# Patient Record
Sex: Female | Born: 1980 | Race: Black or African American | Hispanic: No | Marital: Married | Smoking: Current every day smoker
Health system: Southern US, Community
[De-identification: ages and names within clinical notes are randomized; demographics above are authoritative.]

## PROBLEM LIST (undated history)

## (undated) DIAGNOSIS — F32A Depression, unspecified: Secondary | ICD-10-CM

## (undated) DIAGNOSIS — F329 Major depressive disorder, single episode, unspecified: Secondary | ICD-10-CM

## (undated) DIAGNOSIS — E282 Polycystic ovarian syndrome: Secondary | ICD-10-CM

## (undated) HISTORY — DX: Polycystic ovarian syndrome: E28.2

## (undated) HISTORY — PX: GALLBLADDER SURGERY: SHX652

## (undated) HISTORY — DX: Major depressive disorder, single episode, unspecified: F32.9

## (undated) HISTORY — PX: TONSILLECTOMY: SUR1361

## (undated) HISTORY — DX: Depression, unspecified: F32.A

## (undated) HISTORY — PX: SLEEVE GASTROPLASTY: SHX1101

---
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
## 2002-08-05 ENCOUNTER — Emergency Department (HOSPITAL_COMMUNITY): Admission: EM | Admit: 2002-08-05 | Discharge: 2002-08-05 | Payer: Self-pay | Admitting: *Deleted

## 2002-08-05 ENCOUNTER — Encounter: Payer: Self-pay | Admitting: *Deleted

## 2002-10-09 ENCOUNTER — Encounter: Payer: Self-pay | Admitting: Emergency Medicine

## 2002-10-09 ENCOUNTER — Emergency Department (HOSPITAL_COMMUNITY): Admission: EM | Admit: 2002-10-09 | Discharge: 2002-10-09 | Payer: Self-pay | Admitting: Emergency Medicine

## 2003-01-25 ENCOUNTER — Other Ambulatory Visit: Admission: RE | Admit: 2003-01-25 | Discharge: 2003-01-25 | Payer: Self-pay | Admitting: Gynecology

## 2003-11-05 ENCOUNTER — Other Ambulatory Visit: Admission: RE | Admit: 2003-11-05 | Discharge: 2003-11-05 | Payer: Self-pay | Admitting: Gynecology

## 2003-12-05 ENCOUNTER — Encounter: Admission: RE | Admit: 2003-12-05 | Discharge: 2004-03-04 | Payer: Self-pay | Admitting: Gynecology

## 2004-01-20 ENCOUNTER — Inpatient Hospital Stay (HOSPITAL_COMMUNITY): Admission: AD | Admit: 2004-01-20 | Discharge: 2004-01-20 | Payer: Self-pay | Admitting: Gynecology

## 2004-04-04 ENCOUNTER — Inpatient Hospital Stay (HOSPITAL_COMMUNITY): Admission: AD | Admit: 2004-04-04 | Discharge: 2004-04-04 | Payer: Self-pay | Admitting: Gynecology

## 2004-04-20 ENCOUNTER — Inpatient Hospital Stay (HOSPITAL_COMMUNITY): Admission: AD | Admit: 2004-04-20 | Discharge: 2004-04-24 | Payer: Self-pay | Admitting: Gynecology

## 2004-04-21 ENCOUNTER — Encounter (INDEPENDENT_AMBULATORY_CARE_PROVIDER_SITE_OTHER): Payer: Self-pay | Admitting: Specialist

## 2004-06-11 ENCOUNTER — Other Ambulatory Visit: Admission: RE | Admit: 2004-06-11 | Discharge: 2004-06-11 | Payer: Self-pay | Admitting: Gynecology

## 2005-07-01 ENCOUNTER — Other Ambulatory Visit: Admission: RE | Admit: 2005-07-01 | Discharge: 2005-07-01 | Payer: Self-pay | Admitting: Gynecology

## 2005-09-03 ENCOUNTER — Emergency Department (HOSPITAL_COMMUNITY): Admission: EM | Admit: 2005-09-03 | Discharge: 2005-09-03 | Payer: Self-pay | Admitting: Emergency Medicine

## 2006-05-02 ENCOUNTER — Emergency Department (HOSPITAL_COMMUNITY): Admission: EM | Admit: 2006-05-02 | Discharge: 2006-05-03 | Payer: Self-pay | Admitting: Emergency Medicine

## 2006-05-09 ENCOUNTER — Emergency Department (HOSPITAL_COMMUNITY): Admission: EM | Admit: 2006-05-09 | Discharge: 2006-05-10 | Payer: Self-pay | Admitting: Emergency Medicine

## 2006-07-23 ENCOUNTER — Emergency Department (HOSPITAL_COMMUNITY): Admission: EM | Admit: 2006-07-23 | Discharge: 2006-07-23 | Payer: Self-pay | Admitting: Emergency Medicine

## 2006-08-11 ENCOUNTER — Ambulatory Visit (HOSPITAL_COMMUNITY): Admission: RE | Admit: 2006-08-11 | Discharge: 2006-08-11 | Payer: Self-pay | Admitting: Obstetrics

## 2006-08-31 ENCOUNTER — Emergency Department (HOSPITAL_COMMUNITY): Admission: EM | Admit: 2006-08-31 | Discharge: 2006-08-31 | Payer: Self-pay | Admitting: Emergency Medicine

## 2006-12-19 ENCOUNTER — Emergency Department (HOSPITAL_COMMUNITY): Admission: EM | Admit: 2006-12-19 | Discharge: 2006-12-19 | Payer: Self-pay | Admitting: Emergency Medicine

## 2007-04-26 ENCOUNTER — Emergency Department (HOSPITAL_COMMUNITY): Admission: EM | Admit: 2007-04-26 | Discharge: 2007-04-26 | Payer: Self-pay | Admitting: Emergency Medicine

## 2007-05-15 ENCOUNTER — Emergency Department (HOSPITAL_COMMUNITY): Admission: EM | Admit: 2007-05-15 | Discharge: 2007-05-15 | Payer: Self-pay | Admitting: Emergency Medicine

## 2007-05-17 ENCOUNTER — Emergency Department (HOSPITAL_COMMUNITY): Admission: EM | Admit: 2007-05-17 | Discharge: 2007-05-18 | Payer: Self-pay | Admitting: Emergency Medicine

## 2007-05-22 ENCOUNTER — Emergency Department (HOSPITAL_COMMUNITY): Admission: EM | Admit: 2007-05-22 | Discharge: 2007-05-22 | Payer: Self-pay | Admitting: Emergency Medicine

## 2007-07-12 ENCOUNTER — Emergency Department (HOSPITAL_COMMUNITY): Admission: EM | Admit: 2007-07-12 | Discharge: 2007-07-12 | Payer: Self-pay | Admitting: Emergency Medicine

## 2007-09-27 ENCOUNTER — Emergency Department (HOSPITAL_COMMUNITY): Admission: EM | Admit: 2007-09-27 | Discharge: 2007-09-27 | Payer: Self-pay | Admitting: Emergency Medicine

## 2007-10-05 ENCOUNTER — Emergency Department (HOSPITAL_COMMUNITY): Admission: EM | Admit: 2007-10-05 | Discharge: 2007-10-05 | Payer: Self-pay | Admitting: Emergency Medicine

## 2007-10-16 ENCOUNTER — Emergency Department (HOSPITAL_COMMUNITY): Admission: EM | Admit: 2007-10-16 | Discharge: 2007-10-16 | Payer: Self-pay | Admitting: Emergency Medicine

## 2008-06-28 IMAGING — CR DG PORTABLE PELVIS
1 series · 1 of 1 positions shown · non-contrast
Comparison: none

CLINICAL DATA: Low back pain.  
PORTABLE PELVIS ? 1 VIEW:

[view not recorded]
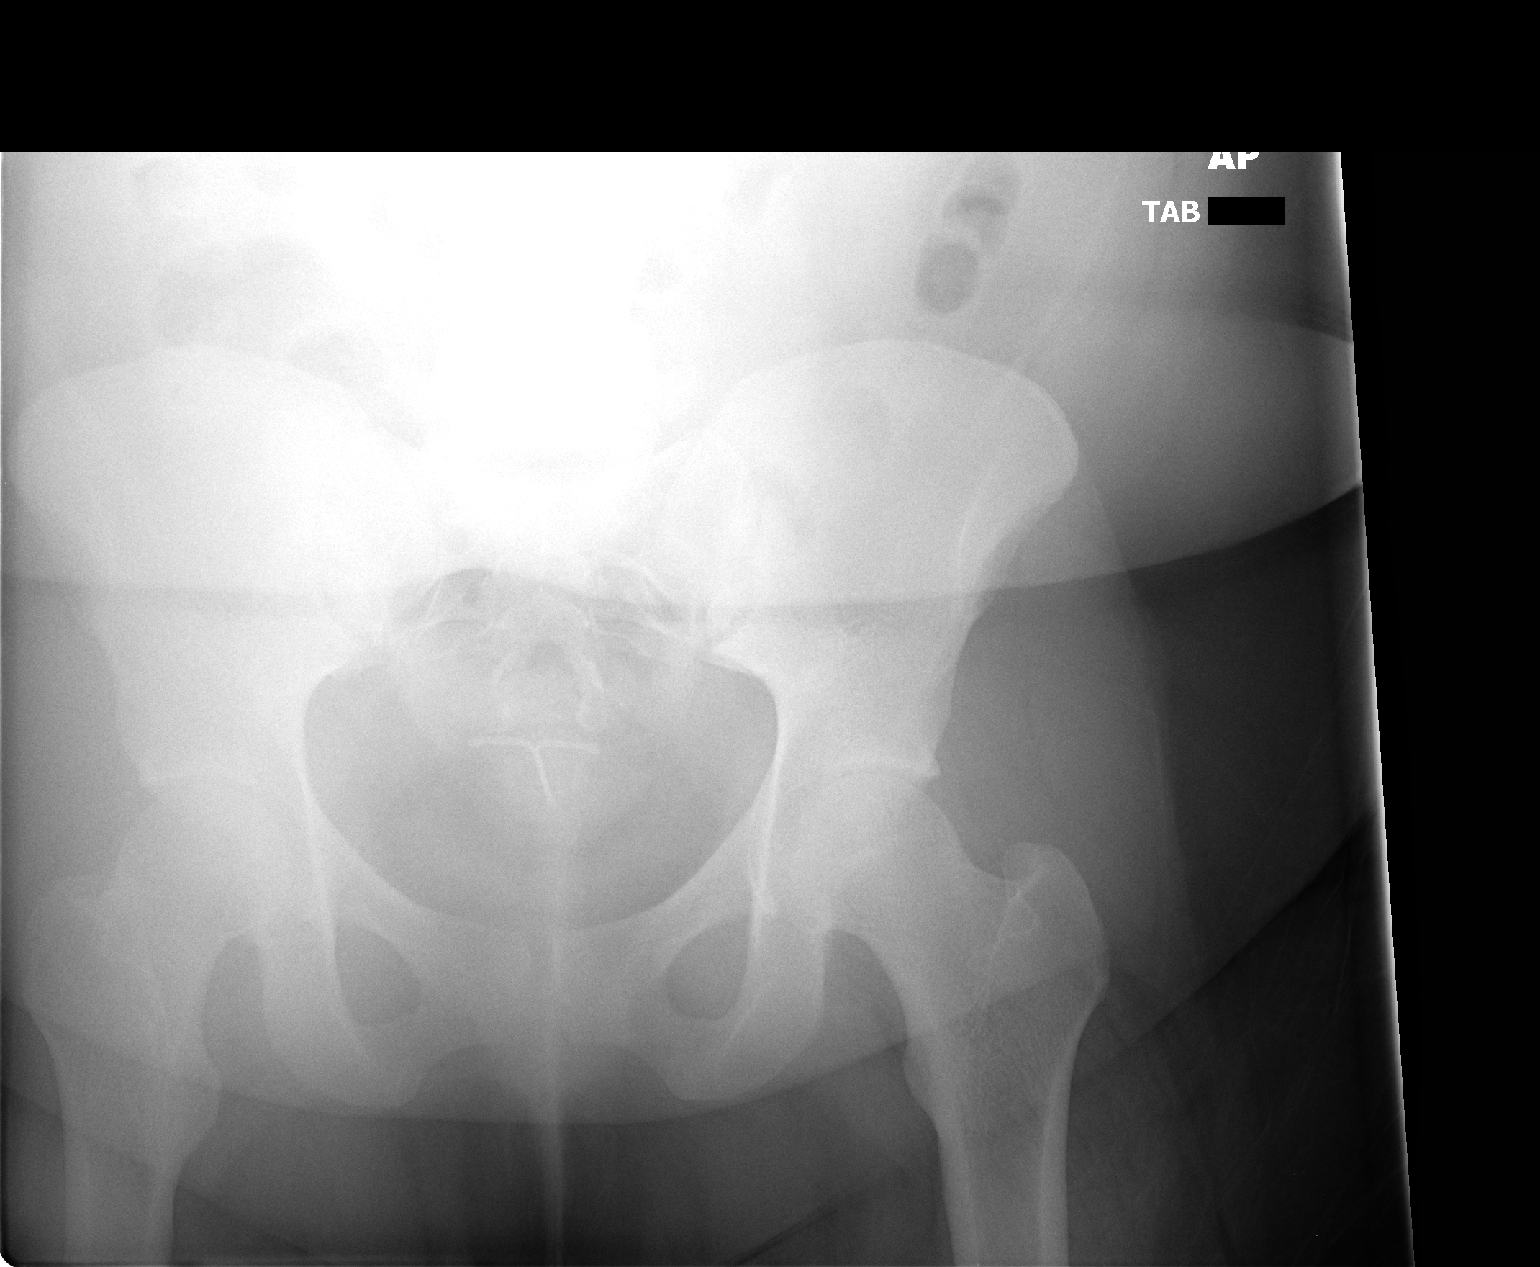

[1 of 1 positions shown; findings below may reference images not displayed]

FINDINGS: Exam is limited because of patient?s size.  No displaced pelvic fracture or diastasis.  IUD projects over the sacrum.
IMPRESSION: Limited exam but no acute finding.

## 2008-09-10 ENCOUNTER — Encounter: Payer: Self-pay | Admitting: Gynecology

## 2008-09-10 ENCOUNTER — Ambulatory Visit: Payer: Self-pay | Admitting: Gynecology

## 2008-09-10 ENCOUNTER — Other Ambulatory Visit: Admission: RE | Admit: 2008-09-10 | Discharge: 2008-09-10 | Payer: Self-pay | Admitting: Gynecology

## 2008-09-24 ENCOUNTER — Ambulatory Visit: Payer: Self-pay | Admitting: Gynecology

## 2008-10-01 ENCOUNTER — Emergency Department (HOSPITAL_COMMUNITY): Admission: EM | Admit: 2008-10-01 | Discharge: 2008-10-01 | Payer: Self-pay | Admitting: Emergency Medicine

## 2008-12-04 ENCOUNTER — Emergency Department (HOSPITAL_COMMUNITY): Admission: EM | Admit: 2008-12-04 | Discharge: 2008-12-04 | Payer: Self-pay | Admitting: Emergency Medicine

## 2008-12-05 ENCOUNTER — Encounter: Payer: Self-pay | Admitting: Gynecology

## 2008-12-05 ENCOUNTER — Ambulatory Visit: Payer: Self-pay | Admitting: Gynecology

## 2008-12-11 ENCOUNTER — Ambulatory Visit: Payer: Self-pay | Admitting: Gynecology

## 2008-12-24 ENCOUNTER — Ambulatory Visit: Payer: Self-pay | Admitting: Gynecology

## 2008-12-31 ENCOUNTER — Ambulatory Visit: Payer: Self-pay | Admitting: Gynecology

## 2009-01-28 ENCOUNTER — Ambulatory Visit: Payer: Self-pay | Admitting: Gynecology

## 2009-02-25 ENCOUNTER — Ambulatory Visit: Payer: Self-pay | Admitting: Gynecology

## 2009-04-16 ENCOUNTER — Ambulatory Visit: Payer: Self-pay | Admitting: Gynecology

## 2009-06-20 ENCOUNTER — Ambulatory Visit: Payer: Self-pay | Admitting: Gynecology

## 2009-06-24 ENCOUNTER — Emergency Department (HOSPITAL_COMMUNITY): Admission: EM | Admit: 2009-06-24 | Discharge: 2009-06-25 | Payer: Self-pay | Admitting: Emergency Medicine

## 2009-09-11 ENCOUNTER — Ambulatory Visit: Payer: Self-pay | Admitting: Gynecology

## 2009-09-11 ENCOUNTER — Other Ambulatory Visit: Admission: RE | Admit: 2009-09-11 | Discharge: 2009-09-11 | Payer: Self-pay | Admitting: Gynecology

## 2010-11-10 IMAGING — CT CT HEAD W/O CM
1 of 2 series · 13 of 30 positions shown, 17 images · non-contrast
Comparison: None

CLINICAL DATA: Left sided numbness with weakness.

CT HEAD WITHOUT CONTRAST
TECHNIQUE: Contiguous axial images were obtained from the base of
the skull through the vertex without contrast.

[Series 2: brain · axial · 0.47mm/px · z∈[+153,+279]mm · 13 of 28 slices shown, 17 images]
[im 2/28  brain]
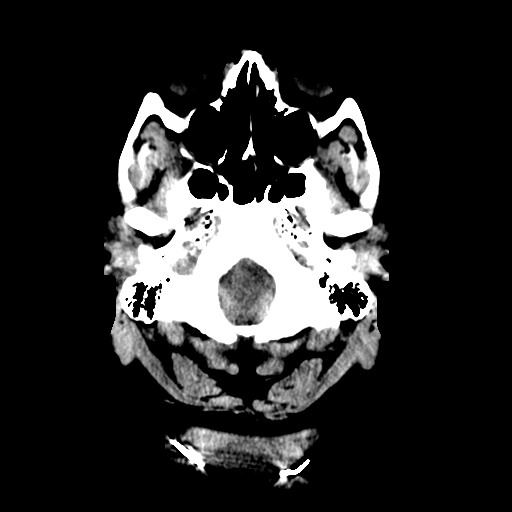
[im 2/28  bone]
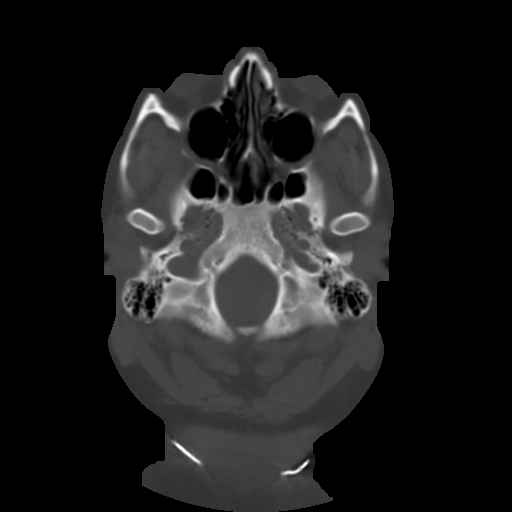
[im 4/28  brain]
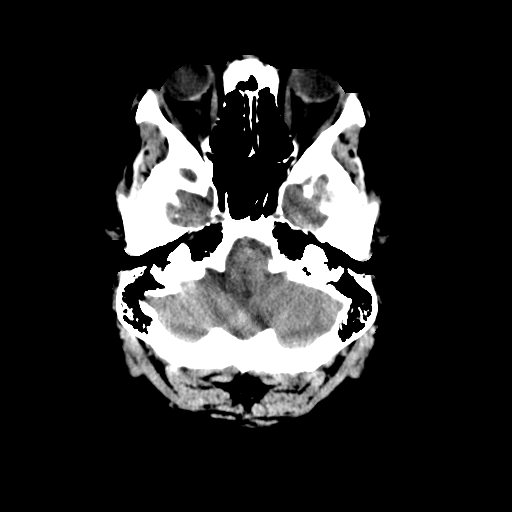
[im 6/28  brain]
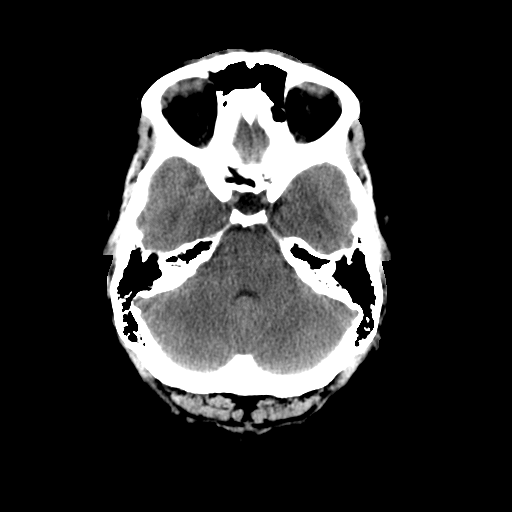
[im 8/28  brain]
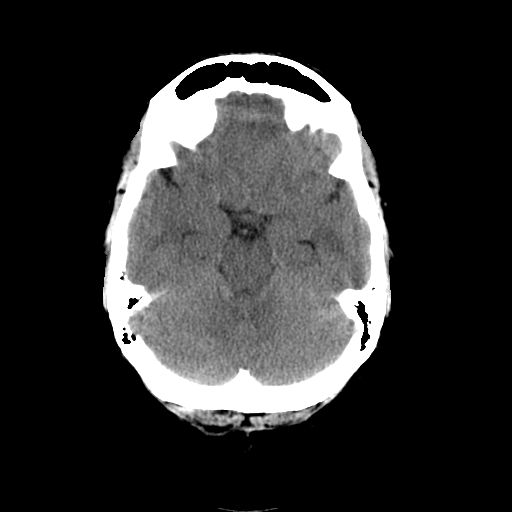
[im 10/28  brain]
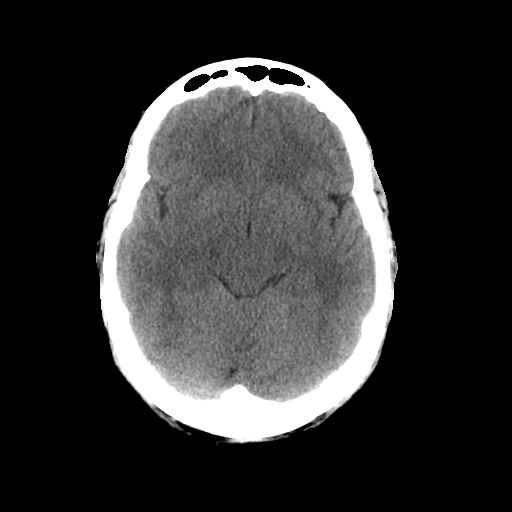
[im 10/28  bone]
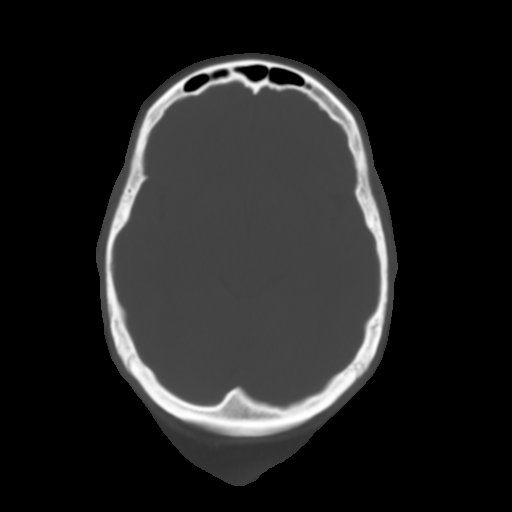
[im 12/28  brain]
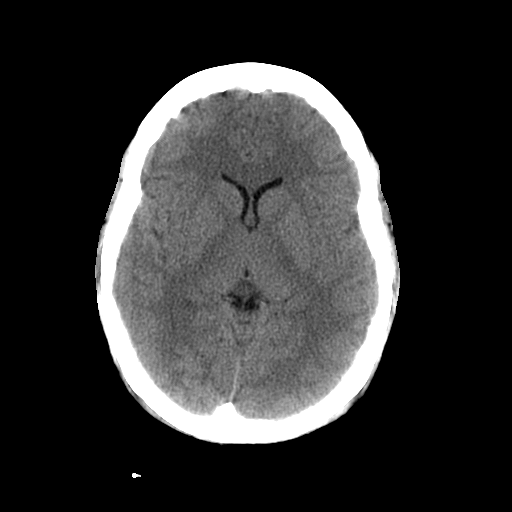
[im 14/28  brain]
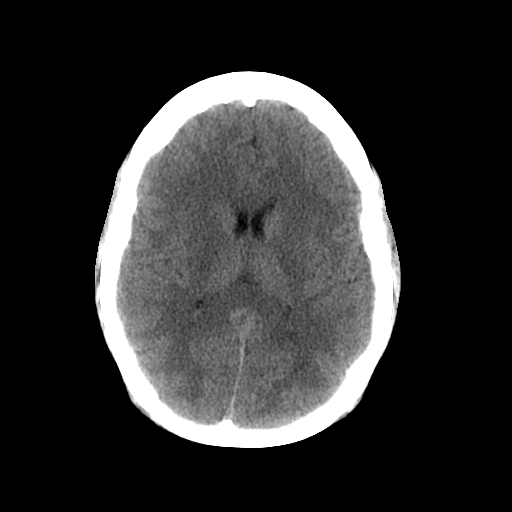
[im 16/28  brain]
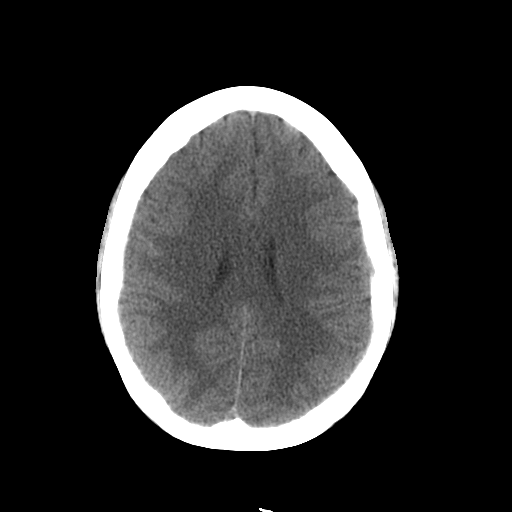
[im 18/28  brain]
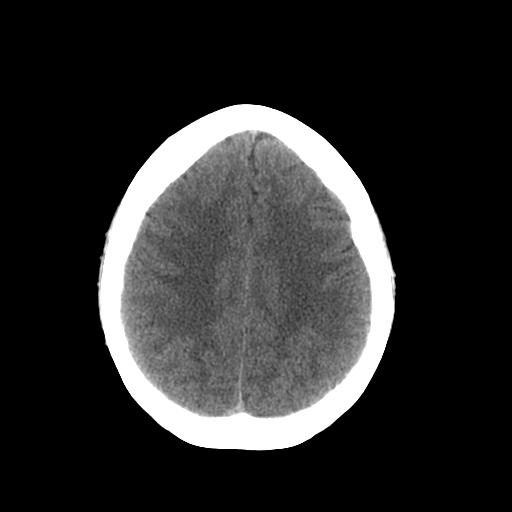
[im 18/28  bone]
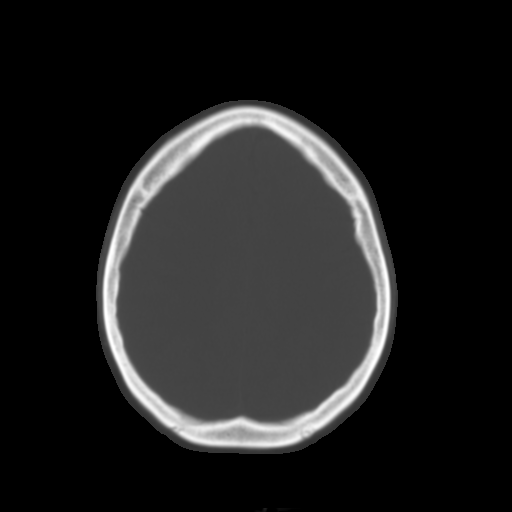
[im 20/28  brain]
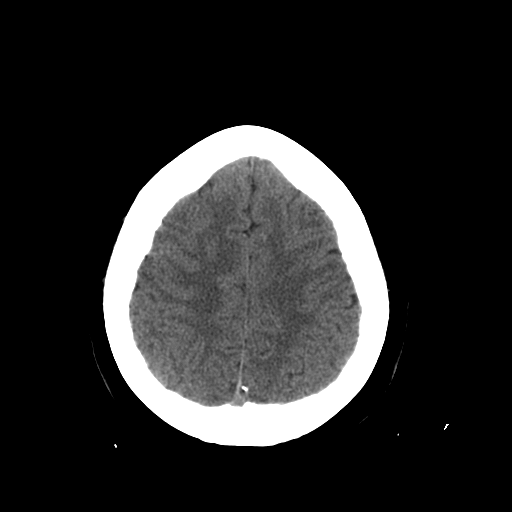
[im 22/28  brain]
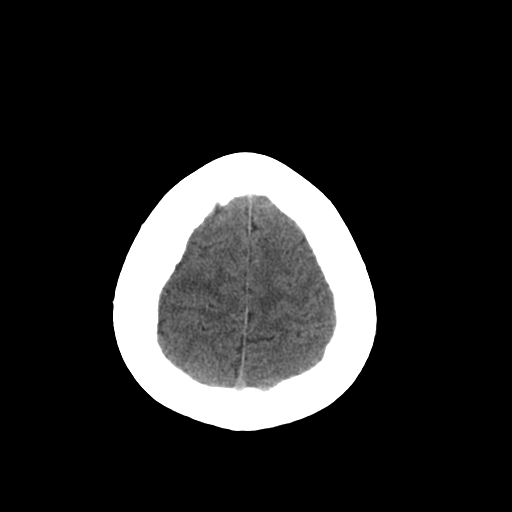
[im 24/28  brain]
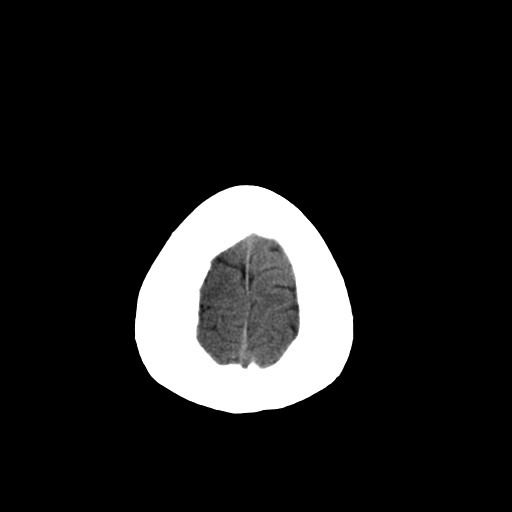
[im 26/28  brain]
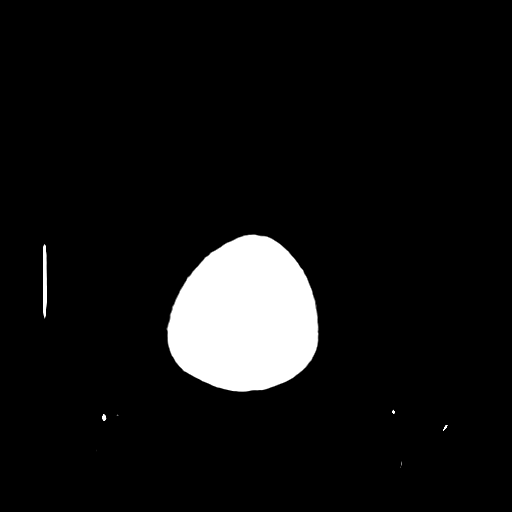
[im 26/28  bone]
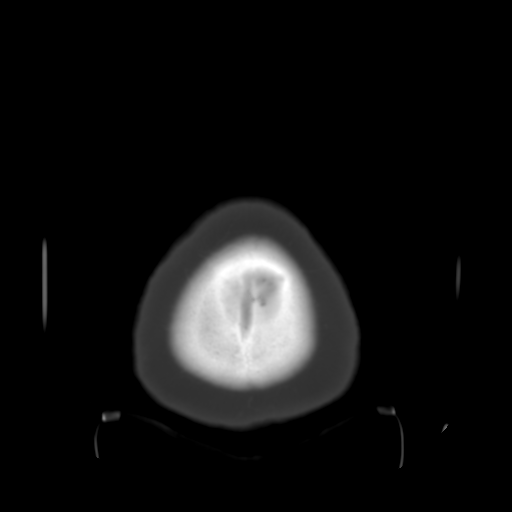

[13 of 30 positions shown; findings below may reference images not displayed]

FINDINGS: The cerebral and cerebellar parenchyma are symmetric and
normal appearance.  The ventricles and basilar cisterns are midline
without effacement or mass effect.  The mastoid air cells and
sinuses are clear.
IMPRESSION: No acute intracranial hemorrhage or edema.

## 2011-01-29 LAB — URINE MICROSCOPIC-ADD ON

## 2011-01-29 LAB — URINALYSIS, ROUTINE W REFLEX MICROSCOPIC
Glucose, UA: NEGATIVE mg/dL
Nitrite: NEGATIVE
Specific Gravity, Urine: 1.017 (ref 1.005–1.030)

## 2011-01-29 LAB — POCT PREGNANCY, URINE: Preg Test, Ur: NEGATIVE

## 2011-03-12 NOTE — Discharge Summary (Signed)
[name] [name], [name] [name]                           ACCOUNT NO.:  [id number]   MEDICAL RECORD NO.:  [id number]                   PATIENT TYPE:  INP   LOCATION:  9118                                 FACILITY:  [facility]   PHYSICIAN:  Juan H. Lily Peer, M.D.             DATE OF BIRTH:  29-Dec-1980   DATE OF ADMISSION:  04/20/2004  DATE OF DISCHARGE:                                 DISCHARGE SUMMARY   TOTAL DAYS HOSPITALIZED:  Three.   HISTORY:  The patient is a 30 year old gravida 1 now para 1 who was admitted  for induction at [redacted] weeks gestation on April 20, 2004 where she underwent  Cervidil for cervical ripening and then Pitocin on the morning of June 28.  The patient was taken to the operating room for a primary cesarean section  later in the day due to concerns of arrest of cervical dilatation at 3 cm,  90% effaced, -3 station despite adequate labor pattern; occasional mild  variables were noted.  There was concern about possibly underlying CPD.  She  went on to deliver via lower uterine segment transverse cesarean section  whereby a viable female infant, Apgars of 8 and 9, with a weight of 7 pounds  11 ounces.  Arterial cord ph was 7.26.  There was a nuchal cord x1 and the  baby was in the OP position.  Amniotic fluid was normal and also normal  maternal pelvic anatomy.  She had a 700 mL blood loss, did well  postoperatively.  Her postoperative day #1 hemoglobin and hematocrit were  10.5 and 31.0 respectively with a platelet count of 188,000.  Tmax was  100.6.  Blood type B positive, rubella immune.  She had not passed any  flatus at that time and the repeat temperature was 99.9.  Her postoperative  day #2 she was up ambulating, tolerating a regular diet well, and on  postoperative day #3 she continued to remain afebrile, tolerating a regular  diet, passed flatus.  Her incision site was intact and lochia was  decreasing.  She still had about 1+ pitting edema but was normotensive and  was ready  to be discharged.   FINAL DIAGNOSES:  1. Post dates pregnancy.  2. Failed induction.  3. Arrest of first stage of labor.  4. Cephalopelvic disproportion.  5. Variable decelerations.   PROCEDURE PERFORMED:  Primary lower uterine segment transverse cesarean  section.   DISPOSITION AND FOLLOW-UP:  The patient was discharged home on her  postoperative day #3.  Her staples were removed.  Her incision site had  Steri-Strips applied.  She was discharged home on Tylox one to two tablets  p.o. q.4-6h. p.r.n. pain, one prenatal vitamin daily, and one iron sulfate  tablet daily.  She will return to the office in 6 weeks for a postoperative  visit at which time we will be checking her CBC.  GGA discharge booklet was  provided.  All questions were answered and will follow accordingly.                                               Juan H. Lily Peer, M.D.    JHF/MEDQ  D:  04/24/2004  T:  04/24/2004  Job:  [id number]

## 2011-03-12 NOTE — H&P (Signed)
NAME:  [name] [name]                           ACCOUNT NO.:  0   MEDICAL RECORD NO.:  [id number]                     PATIENT TYPE:  MAT   LOCATION:                                       FACILITY:  [facility]   PHYSICIAN:  Juan H. Lily Peer, M.D.             DATE OF BIRTH:  02/24/1981   DATE OF ADMISSION:  04/20/2004  DATE OF DISCHARGE:                                HISTORY & PHYSICAL   CHIEF COMPLAINT:  Post dates.   HISTORY:  The patient is a 30 year old gravida 1, para 0, with an estimated  date of confinement of April 14, 2004.  The patient would be 40-6/[redacted] weeks  gestation on admission this evening.  The patient was seen in the office  today and her cervix was 1 cm, 50% and -3.  Scheduled to undergo cervical  ripening with Cervidil tonight followed by Pitocin early in the morning.  She did have an ultrasound in the office on April 14, 2004, whereby an  estimated fetal weight was recorded 7 pounds, 7 ounces (3502 g), 45th  percentile for [redacted] week gestation.  The AFI had been 7.4, less than a 10%  __________.  She had an NST that was reactive.  She returned back on April 17, 2004, for a followup AFI.  NST was reactive and AFI was up to 9.2,  normal limits, 20% __________ for 40-1/[redacted] weeks gestation.  She was in vertex  presentation.  Her Group B Strep status was negative.  Due to the fact that  she was overweight, she was sent to the nutritionist where she was followed  and placed on appropriate diet.  Her diabetes screen had been normal at 108,  otherwise she had a benign prenatal course.   PAST MEDICAL HISTORY:  SHE DENIES ANY ALLERGIES.  No history of sexually  transmitted diseases reported.  Otherwise benign medical history.   REVIEW OF SYSTEMS:  See [place] form.   PHYSICAL EXAMINATION:  Blood pressure stable at 134/80, urine trace protein,  negative glucose, weight 244 pounds.  HEENT:  Unremarkable.  NECK:  Supple.  Trachea midline.  No carotid bruits, no thyromegaly.  LUNGS:   Clear to auscultation without rhonchi or wheezes.  HEART:  Regular rate and rhythm, no murmurs or gallop.  BREAST EXAM:  Not done.  ABDOMEN:  Gravid uterus.  Fundal height 40 cm.  PELVIC EXAM:  Cervix 1 cm, 50%, -3 station.  EXTREMITIES:  1+ pitting edema, DTR 1+ near the conus.   PRENATAL LABS:  B positive blood type.  Negative antibody screen.  VDRL was  nonreactive, Rubella immune.  Hepatitis B Surface Antigen and HIV were  negative.  Alpha fetoprotein was normal.  Diabetes screen was normal.  GBS  culture was negative and patient had iron deficiency anemia for which she  was on supplemental iron and her Pap smear was normal.   ASSESSMENT:  30 year-old gravida 1, para 0, at 40-6/[redacted] weeks gestation.  Will be admitted this evening for induction secondary to post dates.  She  will receive Cervidil for cervical ripening followed by Pitocin IV the  following morning and anticipate vaginal delivery.  The risks, benefits,  pros and cons of procedure were discussed with the patient and her mother,  who was present.  All questions were answered and will follow accordingly.   PLAN:  The patient is scheduled to be admitted this evening for induction  secondary to post dates.                                               Juan H. Lily Peer, M.D.    JHF/MEDQ  D:  04/20/2004  T:  04/20/2004  Job:  [id number]

## 2011-03-12 NOTE — Op Note (Signed)
NAMEJOSEPHENE, MARRONE                           ACCOUNT NO.:  [id number]   MEDICAL RECORD NO.:  [id number]                   PATIENT TYPE:  INP   LOCATION:  9118                                 FACILITY:  [facility]   PHYSICIAN:  Juan H. Lily Peer, M.D.             DATE OF BIRTH:  [birth year]   DATE OF PROCEDURE:  04/21/2004  DATE OF DISCHARGE:                                 OPERATIVE REPORT   PREOPERATIVE DIAGNOSES:  1. Post date pregnancy.  2. Arrest of first stage of labor.  3. Rule out cephalopelvic disproportion.  4. Variable accelerations.   POSTOPERATIVE DIAGNOSES:  1. Post date pregnancy.  2. Arrest of first stage of labor.  3. Rule out cephalopelvic disproportion.  4. Variable accelerations.   ANESTHESIA:  Spinal.   PROCEDURE:  Primary lower uterine segment transverse cesarean section.   SURGEON:  Juan H. Lily Peer, M.D.   INDICATIONS FOR PROCEDURE:  A 30 year old, gravida 1, para 0 at 3 weeks  estimated gestational age who is being taken for a primary cesarean section  secondary to arrest of first stage of labor. The patient did not progress  beyond 3-4 cm, 80% effaced and -3 station despite adequate labor pattern.  Suspect cephalopelvic disproportion and also fetal heart rate tracing  starting to show evidence of repetitive variable decelerations.   FINDINGS:  Viable female infant, Apgar of 8 & 9 with a weight of 7 pounds 11  ounces, arterial cord pH of 7.26.  The fetus was in the OP presentation with  a nuchal cord x1 as well as a cord around the arm.  Normal maternal pelvic  anatomy and clear though minimal amniotic fluid.   DESCRIPTION OF PROCEDURE:  After the patient was adequately counseled, she  was taken to the operating room where she underwent a successful spinal  placement. Fetal heart tones were appreciated right to the commencement of  the uterine incision. After the abdomen was prepped and draped in the usual  sterile fashion, a Foley catheter in place, a  Pfannenstiel skin incision was  made 2 cm above the symphysis pubis, the incision was carried down from the  skin and subcutaneous tissue down to the rectus fascia whereby a midline  nick was made. The fascia was incised in a transverse fashion, the midline  raphe was entered, the peritoneal cavity was entered cautiously. The bladder  flap was established, the lower uterine segment was incised in a transverse  fashion and the newborn's head was delivered after freeing the nuchal cord.  The nasopharyngeal area was bulb suctioned and also cord was wrapped around  the left upper extremity as well which was manually reduced. After the cord  was doubly clamped and excised, the newborn was passed off to the  pediatricians who gave the above mentioned parameters.  After cord blood was  obtained, the placenta was delivered from the intrauterine cavity and  submitted for pathological evaluation.  The intrauterine cavity was swept  clear of the remaining products of conception, the uterus was closed in a  single locking fashion with #0 Vicryl suture. Both tubes and ovaries were  inspected and appeared to be normal in appearance. The uterus was placed  back into the pelvic cavity, the pelvic cavity was copiously irrigated with  normal saline solution. Inspection of the lower uterine segment demonstrated  adequate hemostasis. The vesicoperitoneum was not closed but the rectus  fascia was closed with a running stitch of #0 Vicryl suture. The  subcutaneous bleeders were Bovie cauterized, the skin was reapproximated  with skin clips followed placing a 4 x 8 dressing.  The patient was  transferred to the recovery room with stable vital signs.  Blood loss during  the procedure was 700 mL.  IV fluids were 2200 mL of lactated Ringer's.  Urine output approximately 200 mL.                                               Juan H. Lily Peer, M.D.    JHF/MEDQ  D:  04/21/2004  T:  04/21/2004  Job:  [id number]

## 2011-07-30 LAB — POCT I-STAT, CHEM 8
BUN: 6 mg/dL (ref 6–23)
Creatinine, Ser: 1 mg/dL (ref 0.4–1.2)
Glucose, Bld: 94 mg/dL (ref 70–99)
HCT: 39 % (ref 36.0–46.0)
Hemoglobin: 13.3 g/dL (ref 12.0–15.0)
Potassium: 3.6 mEq/L (ref 3.5–5.1)
Sodium: 143 mEq/L (ref 135–145)
TCO2: 22 mmol/L (ref 0–100)

## 2011-07-30 LAB — CBC
HCT: 39.3 % (ref 36.0–46.0)
MCV: 88.8 fL (ref 78.0–100.0)
RBC: 4.43 MIL/uL (ref 3.87–5.11)
WBC: 13.1 10*3/uL — ABNORMAL HIGH (ref 4.0–10.5)

## 2011-07-30 LAB — URINALYSIS, ROUTINE W REFLEX MICROSCOPIC
Bilirubin Urine: NEGATIVE
Hgb urine dipstick: NEGATIVE
Nitrite: NEGATIVE
Protein, ur: NEGATIVE mg/dL
Urobilinogen, UA: 1 mg/dL (ref 0.0–1.0)

## 2011-07-30 LAB — DIFFERENTIAL
Lymphs Abs: 2.8 10*3/uL (ref 0.7–4.0)
Monocytes Absolute: 0.6 10*3/uL (ref 0.1–1.0)

## 2011-08-09 LAB — URINALYSIS, ROUTINE W REFLEX MICROSCOPIC
Bilirubin Urine: NEGATIVE
Glucose, UA: NEGATIVE
Glucose, UA: NEGATIVE
Ketones, ur: NEGATIVE
Nitrite: NEGATIVE
Protein, ur: NEGATIVE
Protein, ur: NEGATIVE
Protein, ur: NEGATIVE
Specific Gravity, Urine: 1.024
Urobilinogen, UA: 1

## 2011-08-09 LAB — COMPREHENSIVE METABOLIC PANEL
BUN: 7
Calcium: 10
Glucose, Bld: 81
Total Protein: 7.9

## 2011-08-09 LAB — DIFFERENTIAL
Basophils Relative: 1
Lymphs Abs: 2.4
Monocytes Relative: 7
Neutro Abs: 4.8
Neutrophils Relative %: 60

## 2011-08-09 LAB — URINE MICROSCOPIC-ADD ON

## 2011-08-09 LAB — URINE CULTURE: Colony Count: >=100000

## 2011-08-09 LAB — GC/CHLAMYDIA PROBE AMP, GENITAL
Chlamydia, DNA Probe: NEGATIVE
GC Probe Amp, Genital: NEGATIVE

## 2011-08-09 LAB — POCT PREGNANCY, URINE
Operator id: 285841
Preg Test, Ur: NEGATIVE
Preg Test, Ur: NEGATIVE
Preg Test, Ur: NEGATIVE

## 2011-08-09 LAB — WET PREP, GENITAL: Yeast Wet Prep HPF POC: NONE SEEN

## 2011-08-09 LAB — CBC
HCT: 46.2 — ABNORMAL HIGH
Hemoglobin: 16 — ABNORMAL HIGH
MCHC: 34.5
MCV: 85.7
RDW: 12.4

## 2011-08-10 LAB — STREP A DNA PROBE

## 2014-07-18 ENCOUNTER — Ambulatory Visit: Payer: Self-pay | Admitting: Gynecology

## 2016-07-13 ENCOUNTER — Ambulatory Visit: Payer: Self-pay | Admitting: Gynecology

## 2016-08-04 ENCOUNTER — Ambulatory Visit (INDEPENDENT_AMBULATORY_CARE_PROVIDER_SITE_OTHER): Payer: BC Managed Care – PPO | Admitting: Gynecology

## 2016-08-04 ENCOUNTER — Encounter: Payer: Self-pay | Admitting: Gynecology

## 2016-08-04 VITALS — BP 130/82 | Wt 248.0 lb

## 2016-08-04 DIAGNOSIS — L304 Erythema intertrigo: Secondary | ICD-10-CM | POA: Diagnosis not present

## 2016-08-04 DIAGNOSIS — Z8041 Family history of malignant neoplasm of ovary: Secondary | ICD-10-CM | POA: Diagnosis not present

## 2016-08-04 DIAGNOSIS — E663 Overweight: Secondary | ICD-10-CM

## 2016-08-04 DIAGNOSIS — Z23 Encounter for immunization: Secondary | ICD-10-CM | POA: Diagnosis not present

## 2016-08-04 DIAGNOSIS — Z01411 Encounter for gynecological examination (general) (routine) with abnormal findings: Secondary | ICD-10-CM

## 2016-08-04 DIAGNOSIS — N926 Irregular menstruation, unspecified: Secondary | ICD-10-CM

## 2016-08-04 MED ORDER — NYSTATIN-TRIAMCINOLONE 100000-0.1 UNIT/GM-% EX CREA: 1.0000 "application " | TOPICAL_CREAM | Freq: Three times a day (TID) | CUTANEOUS | 2 refills | Status: DC

## 2016-08-04 MED ORDER — LEVONORGESTREL-ETHINYL ESTRAD 0.1-20 MG-MCG PO TABS: 1.0000 | ORAL_TABLET | Freq: Every day | ORAL | 11 refills | Status: AC

## 2016-08-04 MED ORDER — NYSTATIN-TRIAMCINOLONE 100000-0.1 UNIT/GM-% EX CREA: 1.0000 "application " | TOPICAL_CREAM | Freq: Three times a day (TID) | CUTANEOUS | 2 refills | Status: AC

## 2016-08-04 NOTE — Progress Notes (Signed)
[name] [name] [birth year] [id number]   History:    35 y.o.  for annual gyn exam  who has not been seen in the office for many years because her husband was stationed overseas. She did state she did have a gynecological exam and Pap smear within the past 2 years and was normal. She is here with several complaints today besides her annual exam. She complains of irregular cycle she will go several months without a menstrual cycle. She denies any nipple discharge or any unusual headaches or visual disturbances. She also is complaining some irritation and pruritus underneath her panniculus. She is also overweight. Patient's mother and sister in the 3830s were diagnosed with ovarian cancer Patient is interested in having the flu vaccine today. Patient has had 3 cesarean sections and with a last and had her tubal ligation. Patient with no previous history of any abnormal Pap smear in the past.  Past medical history,surgical history, family history and social history were all reviewed and documented in the EPIC chart.  Gynecologic History Patient's last menstrual period was 07/22/2016 (approximate). Contraception: tubal ligation Last Pap: Several years ago. Results were: normal Last mammogram: Not indicated. Results were: Not indicated  Obstetric History OB History  Gravida Para Term Preterm AB Living  3       0 3  SAB TAB Ectopic Multiple Live Births      0        # Outcome Date GA Lbr Len/2nd Weight Sex Delivery Anes PTL Lv  3 Gravida           2 Gravida           1 Gravida                ROS: A ROS was performed and pertinent positives and negatives are included in the history.  GENERAL: No fevers or chills. HEENT: No change in vision, no earache, sore throat or sinus congestion. NECK: No pain or stiffness. CARDIOVASCULAR: No chest pain or pressure. No palpitations. PULMONARY: No shortness of breath, cough or wheeze. GASTROINTESTINAL: No abdominal pain, nausea, vomiting or diarrhea, melena  or bright red blood per rectum. GENITOURINARY: No urinary frequency, urgency, hesitancy or dysuria. MUSCULOSKELETAL: No joint or muscle pain, no back pain, no recent trauma. DERMATOLOGIC: No rash, no itching, no lesions. ENDOCRINE: No polyuria, polydipsia, no heat or cold intolerance. No recent change in weight. HEMATOLOGICAL: No anemia or easy bruising or bleeding. NEUROLOGIC: No headache, seizures, numbness, tingling or weakness. PSYCHIATRIC: No depression, no loss of interest in normal activity or change in sleep pattern.     Exam: chaperone present  BP 130/82   Wt 248 lb (112.5 kg)   LMP 07/22/2016 (Approximate)   There is no height or weight on file to calculate BMI.  General appearance : Well developed well nourished female. No acute distress HEENT: Eyes: no retinal hemorrhage or exudates,  Neck supple, trachea midline, no carotid bruits, no thyroidmegaly Lungs: Clear to auscultation, no rhonchi or wheezes, or rib retractions  Heart: Regular rate and rhythm, no murmurs or gallops Breast:Examined in sitting and supine position were symmetrical in appearance, no palpable masses or tenderness,  no skin retraction, no nipple inversion, no nipple discharge, no skin discoloration, no axillary or supraclavicular lymphadenopathy Abdomen: Intertrigo  Extremities: No cords no edema   Pelvic:  Bartholin, Urethra, Skene Glands: Within normal limits             Vagina: No gross lesions or discharge  Cervix: No gross lesions or discharge  Uterus  anteverted, normal size, shape and consistency, non-tender and mobile  Adnexa  Without masses or tenderness  Anus and perineum  normal   Rectovaginal  normal sphincter tone without palpated masses or tenderness             Hemoccult not indicated     Assessment/Plan:  35 y.o. female for annual exam who is overweight and due to family history of ovarian cancer I will Eifert return to the office for an ultrasound next week. Also because of irregular  menses I would like to place her on a 20 g oral contraceptive pill to regulate her cycles but for ovarian suppression and prophylaxis for ovarian cancer because of family history. She very infrequently smokes and we discussed the detrimental effects of smoking and birth control pill potential risk for DVT and pulmonary embolism. Because of her skipping cycles up to 6 months I'm going to check not only a TSH but her prolactin level today. Also she will have a comprehensive metabolic panel, fasting lipid profile, CBC and urinalysis. Pap smear was also done today. For her intertrigo she'll be prescribed mytrex cream to apply twice a day for 7-10 days. Patient did receive the flu vaccine today after she was counseled.   Ok Edwards MD, 4:57 PM 08/04/2016

## 2016-08-04 NOTE — Patient Instructions (Addendum)
Influenza Virus Vaccine (Flucelvax) What is this medicine? INFLUENZA VIRUS VACCINE (in floo EN zuh VAHY ruhs vak SEEN) helps to reduce the risk of getting influenza also known as the flu. The vaccine only helps protect you against some strains of the flu. This medicine may be used for other purposes; ask your health care provider or pharmacist if you have questions. What should I tell my health care provider before I take this medicine? They need to know if you have any of these conditions: -bleeding disorder like hemophilia -fever or infection -Guillain-Barre syndrome or other neurological problems -immune system problems -infection with the human immunodeficiency virus (HIV) or AIDS -low blood platelet counts -multiple sclerosis -an unusual or allergic reaction to influenza virus vaccine, other medicines, foods, dyes or preservatives -pregnant or trying to get pregnant -breast-feeding How should I use this medicine? This vaccine is for injection into a muscle. It is given by a health care professional. A copy of Vaccine Information Statements will be given before each vaccination. Read this sheet carefully each time. The sheet may change frequently. Talk to your pediatrician regarding the use of this medicine in children. Special care may be needed. Overdosage: If you think you've taken too much of this medicine contact a poison control center or emergency room at once. Overdosage: If you think you have taken too much of this medicine contact a poison control center or emergency room at once. NOTE: This medicine is only for you. Do not share this medicine with others. What if I miss a dose? This does not apply. What may interact with this medicine? -chemotherapy or radiation therapy -medicines that lower your immune system like etanercept, anakinra, infliximab, and adalimumab -medicines that treat or prevent blood clots like warfarin -phenytoin -steroid medicines like prednisone or  cortisone -theophylline -vaccines This list may not describe all possible interactions. Give your health care provider a list of all the medicines, herbs, non-prescription drugs, or dietary supplements you use. Also tell them if you smoke, drink alcohol, or use illegal drugs. Some items may interact with your medicine. What should I watch for while using this medicine? Report any side effects that do not go away within 3 days to your doctor or health care professional. Call your health care provider if any unusual symptoms occur within 6 weeks of receiving this vaccine. You may still catch the flu, but the illness is not usually as bad. You cannot get the flu from the vaccine. The vaccine will not protect against colds or other illnesses that may cause fever. The vaccine is needed every year. What side effects may I notice from receiving this medicine? Side effects that you should report to your doctor or health care professional as soon as possible: -allergic reactions like skin rash, itching or hives, swelling of the face, lips, or tongue Side effects that usually do not require medical attention (Report these to your doctor or health care professional if they continue or are bothersome.): -fever -headache -muscle aches and pains -pain, tenderness, redness, or swelling at the injection site -tiredness This list may not describe all possible side effects. Call your doctor for medical advice about side effects. You may report side effects to FDA at 1-800-FDA-1088. Where should I keep my medicine? The vaccine will be given by a health care professional in a clinic, pharmacy, doctor's office, or other health care setting. You will not be given vaccine doses to store at home. NOTE: This sheet is a summary. It may not cover   all possible information. If you have questions about this medicine, talk to your doctor, pharmacist, or health care provider.    2016, Elsevier/Gold Standard. (2011-09-22  14:06:47) Intertrigo Intertrigo is a skin condition that occurs in between folds of skin in places on the body that rub together a lot and do not get much ventilation. It is caused by heat, moisture, friction, sweat retention, and lack of air circulation, which produces red, irritated patches and, sometimes, scaling or drainage. People who have diabetes, who are obese, or who have treatment with antibiotics are at increased risk for intertrigo. The most common sites for intertrigo to occur include:  The groin.  The breasts.  The armpits.  Folds of abdominal skin.  Webbed spaces between the fingers or toes. Intertrigo may be aggravated by:  Sweat.  Feces.  Yeast or bacteria that are present near skin folds.  Urine.  Vaginal discharge. HOME CARE INSTRUCTIONS  The following steps can be taken to reduce friction and keep the affected area cool and dry:  Expose skin folds to the air.  Keep deep skin folds separated with cotton or linen cloth. Avoid tight fitting clothing that could cause chafing.  Wear open-toed shoes or sandals to help reduce moisture between the toes.  Apply absorbent powders to affected areas as directed by your caregiver.  Apply over-the-counter barrier pastes, such as zinc oxide, as directed by your caregiver.  If you develop a fungal infection in the affected area, your caregiver may have you use antifungal creams. SEEK MEDICAL CARE IF:   The rash is not improving after 1 week of treatment.  The rash is getting worse (more red, more swollen, more painful, or spreading).  You have a fever or chills. MAKE SURE YOU:   Understand these instructions.  Will watch your condition.  Will get help right away if you are not doing well or get worse.   This information is not intended to replace advice given to you by your health care provider. Make sure you discuss any questions you have with your health care provider.   Document Released: 10/11/2005  Document Revised: 01/03/2012 Document Reviewed: 04/14/2015 Elsevier Interactive Patient Education [employer]2016 [employer]

## 2016-08-05 LAB — URINALYSIS W MICROSCOPIC + REFLEX CULTURE
BILIRUBIN URINE: NEGATIVE
Casts: NONE SEEN [LPF]
Crystals: NONE SEEN [HPF]
GLUCOSE, UA: NEGATIVE
Hgb urine dipstick: NEGATIVE
Ketones, ur: NEGATIVE
Nitrite: NEGATIVE
PH: 6.5 (ref 5.0–8.0)
PROTEIN: NEGATIVE
SPECIFIC GRAVITY, URINE: 1.007 (ref 1.001–1.035)
Squamous Epithelial / LPF: >60 [HPF] — AB (ref <=5)
Yeast: NONE SEEN [HPF]

## 2016-08-05 LAB — PAP IG W/ RFLX HPV ASCU

## 2016-08-06 LAB — URINE CULTURE

## 2016-08-23 ENCOUNTER — Other Ambulatory Visit: Payer: BC Managed Care – PPO

## 2016-08-23 ENCOUNTER — Ambulatory Visit: Payer: BC Managed Care – PPO | Admitting: Gynecology

## 2016-08-23 DIAGNOSIS — Z0289 Encounter for other administrative examinations: Secondary | ICD-10-CM

## 2017-03-09 ENCOUNTER — Encounter: Payer: Self-pay | Admitting: Gynecology
